# Patient Record
Sex: Male | Born: 1970 | Race: Black or African American | Hispanic: No | Marital: Married | Smoking: Never smoker
Health system: Southern US, Community
[De-identification: ages and names within clinical notes are randomized; demographics above are authoritative.]

## PROBLEM LIST (undated history)

## (undated) DIAGNOSIS — D8685 Sarcoid myocarditis: Secondary | ICD-10-CM

## (undated) DIAGNOSIS — Z950 Presence of cardiac pacemaker: Secondary | ICD-10-CM

## (undated) DIAGNOSIS — I10 Essential (primary) hypertension: Secondary | ICD-10-CM

## (undated) DIAGNOSIS — I442 Atrioventricular block, complete: Secondary | ICD-10-CM

## (undated) HISTORY — DX: Essential (primary) hypertension: I10

## (undated) HISTORY — DX: Sarcoid myocarditis: D86.85

## (undated) HISTORY — DX: Presence of cardiac pacemaker: Z95.0

## (undated) HISTORY — PX: INSERT / REPLACE / REMOVE PACEMAKER: SUR710

## (undated) HISTORY — DX: Atrioventricular block, complete: I44.2

---

## 2001-04-20 ENCOUNTER — Encounter: Payer: Self-pay | Admitting: *Deleted

## 2001-04-20 ENCOUNTER — Inpatient Hospital Stay (HOSPITAL_COMMUNITY): Admission: EM | Admit: 2001-04-20 | Discharge: 2001-04-22 | Payer: Self-pay | Admitting: *Deleted

## 2001-05-24 ENCOUNTER — Encounter: Payer: Self-pay | Admitting: Pulmonary Disease

## 2001-05-24 ENCOUNTER — Ambulatory Visit (HOSPITAL_COMMUNITY): Admission: RE | Admit: 2001-05-24 | Discharge: 2001-05-24 | Payer: Self-pay | Admitting: Pulmonary Disease

## 2001-05-24 ENCOUNTER — Encounter (INDEPENDENT_AMBULATORY_CARE_PROVIDER_SITE_OTHER): Payer: Self-pay | Admitting: *Deleted

## 2001-07-04 ENCOUNTER — Encounter: Payer: Self-pay | Admitting: Internal Medicine

## 2001-07-04 ENCOUNTER — Ambulatory Visit (HOSPITAL_COMMUNITY): Admission: RE | Admit: 2001-07-04 | Discharge: 2001-07-05 | Payer: Self-pay | Admitting: Internal Medicine

## 2001-07-05 ENCOUNTER — Encounter: Payer: Self-pay | Admitting: Internal Medicine

## 2007-03-16 ENCOUNTER — Ambulatory Visit: Payer: Self-pay | Admitting: Internal Medicine

## 2008-04-18 ENCOUNTER — Encounter: Payer: Self-pay | Admitting: Internal Medicine

## 2008-04-25 ENCOUNTER — Ambulatory Visit: Payer: Self-pay

## 2008-07-07 DIAGNOSIS — R079 Chest pain, unspecified: Secondary | ICD-10-CM | POA: Insufficient documentation

## 2008-07-07 DIAGNOSIS — Z950 Presence of cardiac pacemaker: Secondary | ICD-10-CM | POA: Insufficient documentation

## 2008-07-19 ENCOUNTER — Ambulatory Visit: Payer: Self-pay | Admitting: Internal Medicine

## 2008-08-09 ENCOUNTER — Ambulatory Visit: Payer: Self-pay

## 2008-10-02 ENCOUNTER — Ambulatory Visit: Payer: Self-pay | Admitting: Internal Medicine

## 2008-12-18 ENCOUNTER — Ambulatory Visit: Payer: Self-pay | Admitting: Cardiology

## 2008-12-18 ENCOUNTER — Encounter: Payer: Self-pay | Admitting: Internal Medicine

## 2008-12-18 ENCOUNTER — Ambulatory Visit: Payer: Self-pay | Admitting: Internal Medicine

## 2008-12-18 ENCOUNTER — Ambulatory Visit: Payer: Self-pay

## 2008-12-18 ENCOUNTER — Ambulatory Visit (HOSPITAL_COMMUNITY): Admission: RE | Admit: 2008-12-18 | Discharge: 2008-12-18 | Payer: Self-pay | Admitting: Cardiovascular Disease

## 2008-12-18 DIAGNOSIS — I10 Essential (primary) hypertension: Secondary | ICD-10-CM

## 2008-12-18 HISTORY — DX: Essential (primary) hypertension: I10

## 2009-01-10 ENCOUNTER — Ambulatory Visit (HOSPITAL_COMMUNITY): Admission: RE | Admit: 2009-01-10 | Discharge: 2009-01-10 | Payer: Self-pay | Admitting: Internal Medicine

## 2009-01-10 ENCOUNTER — Ambulatory Visit: Payer: Self-pay | Admitting: Internal Medicine

## 2009-01-11 ENCOUNTER — Encounter: Payer: Self-pay | Admitting: Internal Medicine

## 2009-01-28 ENCOUNTER — Encounter: Payer: Self-pay | Admitting: Internal Medicine

## 2009-01-28 ENCOUNTER — Ambulatory Visit: Payer: Self-pay

## 2009-08-23 ENCOUNTER — Encounter (INDEPENDENT_AMBULATORY_CARE_PROVIDER_SITE_OTHER): Payer: Self-pay | Admitting: *Deleted

## 2009-10-25 ENCOUNTER — Encounter (INDEPENDENT_AMBULATORY_CARE_PROVIDER_SITE_OTHER): Payer: Self-pay | Admitting: *Deleted

## 2010-02-25 NOTE — Cardiovascular Report (Signed)
Summary: Office Visit   Office Visit   Imported By: Roderic Ovens 01/31/2009 15:26:53  _____________________________________________________________________  External Attachment:    Type:   Image     Comment:   External Document

## 2010-02-25 NOTE — Procedures (Signed)
Summary: wound check   Current Medications (verified): 1)  None  Allergies (verified): No Known Drug Allergies  PPM Specifications Following MD:  Sherryl Manges, MD     PPM Vendor:  Medtronic     PPM Model Number:  [id number]     PPM Serial Number:  [id number] PPM DOI:  01/10/2009     PPM Implanting MD:  Sherryl Manges, MD  Lead 1    Location: RA     DOI: 07/04/2001     Model #: [id number]     Serial #: [id number]     Status: active Lead 2    Location: RV     DOI: 07/04/2001     Model #: [id number]     Serial #: [id number]     Status: active  Magnet Response Rate:  BOL 85 ERI  65  Indications:  Type II HB: Sarcoid  Explantation Comments:  01/10/2009 [employer] [id number] explanted  PPM Follow Up Remote Check?  No Battery Voltage:  2.79 V     Battery Est. Longevity:  10 YEARS     Pacer Dependent:  No       PPM Device Measurements Atrium  Amplitude: 2.8 mV, Impedance: 494 ohms, Threshold: 0.5 V at 0.4 msec Right Ventricle  Amplitude: 22.4 mV, Impedance: 630 ohms, Threshold: 1.125 V at 0.4 msec  Episodes MS Episodes:  1     Percent Mode Switch:  0%     Coumadin:  No Ventricular High Rate:  0     Atrial Pacing:  10.5%     Ventricular Pacing:  0.6%  Parameters Mode:  MVP (R)     Lower Rate Limit:  60     Upper Rate Limit:  130 Paced AV Delay:  320     Sensed AV Delay:  320 Next Cardiology Appt Due:  04/26/2009 Tech Comments:  Normal device check.  Wound check appt.  Steri-strips removed.  Wound without redness or edema.  No changes made today.  ROV 3 months SK. Gypsy Balsam RN BSN  January 28, 2009 11:45 AM

## 2010-02-25 NOTE — Letter (Signed)
Summary: Device-Delinquent Check  [facility], Main Office  [address]   [place], [place] [address]   Phone: [phone]  Fax: [phone]     August 23, 2009 MRN: [id number]   Kristopher Cannon [address] [place], [place]  [address]   Dear Mr. Rease,  According to our records, you have not had your implanted device checked in the recommended period of time.  We are unable to determine appropriate device function without checking your device on a regular basis.  Please call our office to schedule an appointment as soon as possible.  If you are having your device checked by another physician, please call us so that we may update our records.  Thank you,   Architectural technologist [facility]

## 2010-02-25 NOTE — Letter (Signed)
Summary: Device-Delinquent Check  [facility], Main Office  [address]   [place], [place] [address]   Phone: [phone]  Fax: [phone]     October 25, 2009 MRN: [id number]   ACXEL DINGEE [address] [place], [place]  [address]   Dear Mr. Yoon,  According to our records, you have not had your implanted device checked in the recommended period of time.  We are unable to determine appropriate device function without checking your device on a regular basis.  Please call our office to schedule an appointment with Dr Graciela Husbands ,  as soon as possible.  If you are having your device checked by another physician, please call us so that we may update our records.  Thank you,  Letta Moynahan, EMT  October 25, 2009 12:19 PM  [facility]

## 2010-04-28 LAB — BASIC METABOLIC PANEL
BUN: 15 mg/dL (ref 6–23)
Calcium: 8.8 mg/dL (ref 8.4–10.5)
Creatinine, Ser: 0.81 mg/dL (ref 0.4–1.5)
GFR calc non Af Amer: >60 mL/min (ref >60)
Potassium: 4.5 mEq/L (ref 3.5–5.1)

## 2010-04-28 LAB — CBC
HCT: 41 % (ref 39.0–52.0)
Platelets: 219 10*3/uL (ref 150–400)
WBC: 6.4 10*3/uL (ref 4.0–10.5)

## 2010-04-28 LAB — APTT: aPTT: 34 seconds (ref 24–37)

## 2010-04-28 LAB — PROTIME-INR
INR: 1.13 (ref 0.00–1.49)
Prothrombin Time: 14.4 seconds (ref 11.6–15.2)

## 2010-06-10 NOTE — Letter (Signed)
March 16, 2007    Carmell Austria, PA  [facility]  [address]  [place], [place] [address]   RE:  Kristopher, Cannon  MRN:  [id number]  /  DOB:  10/03/1970   Dear Carmell Austria:   I hope this letter finds you well.  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  It  has been about 4 years since we have seen him.  He has had no complaints  of chest pain or shortness of breath.  He finally came back in for  device follow-up today.   He is taking no medication.  He has no known drug allergies.   On examination, his blood pressure today was recorded on the wrong sheet  of paper, actually.  LUNGS:  Clear.  Heart sounds were regular.  EXTREMITIES:  Without edema.  Neck veins were flat.  The carotids were brisk and full bilaterally  without bruits.  ABDOMEN:  Soft.   Interrogation of his Guidant Insignia pulse generator demonstrates that  he is at [employer]  His P wave is 5.5 with impedance of 400, a threshold of  06 at 0.4.  The R wave was 12 with an impedance of 560, a threshold 0.1  at 0.6.  Estimated longevity is 1.2 years.   IMPRESSION:  1. Sarcoid heart disease.  2. Intermittent complete heart block.  3. Status post pacer for the above.  4. Poor follow-up.   Dr. Thomasena Edis, Kristopher Cannon is stable.  At this point we will plan to him  again in 6 months' time for device follow-up.   If there is anything we can do in the interim, please do not hesitate to  contact me.  My cell number is [phone].  Call for any questions you  might have about him or anybody else.    Sincerely,      Duke Salvia, MD, [facility]  Electronically Signed    SCK/MedQ  DD: 03/16/2007  DT: 03/17/2007  Job #: [id number]

## 2010-06-13 NOTE — H&P (Signed)
[place]. [facility]  Patient:    Kristopher Cannon, Kristopher Cannon Visit Number: [id number] MRN: [id number]          Service Type: MED Location: [id number] Attending Physician:  Junious Silk Dictated by:   Madolyn Frieze. Jens Som, M.D. [facility] Admit Date:  04/20/2001                           History and Physical  CHIEF COMPLAINT: The patient is a 40 year old male, with a past medical history of murmur by report, knee surgery, and hernia repair.  He presents for evaluation of chest pain.  HISTORY OF PRESENT ILLNESS: The patient has no prior cardiac history other than a murmur that he states was noted six years ago.  Over the past three weeks he has complained of chest pain.  This occurs in various locations in the chest and is described as a dull aching sensation without radiation.  It is not pleuritic or positional and is not related to food.  It is not exertional.  It lasts anywhere from five to 30 minutes and resolves spontaneously.  Of note, he denies any recent URI and there is no history of syncope, palpitations, dyspnea on exertion, orthopnea, PND, or pedal edema. There is no family history of sarcoid or sudden death.  He was seen at [facility] today and was referred to the emergency room.  He is presently asymptomatic.  MEDICATIONS: He is on no medications at present.  ALLERGIES: No known drug allergies.  SOCIAL HISTORY: He has a remote history of tobacco use but none in the past year and a half.  He denies any alcohol use.  He does have a history of cocaine use approximately six years ago.  FAMILY HISTORY: He denies coronary artery disease, sudden death or sarcoid. He does have a history of CVA.  PAST MEDICAL HISTORY: There is no hypertension, diabetes mellitus, or hyperlipidemia.  He states he was told he had a murmur six years ago.  PAST SURGICAL HISTORY:  1. Prior knee surgery.  2. Hernia repair.  REVIEW OF SYSTEMS: There  is no headache, fever or chills.  There is no productive cough or hemoptysis.  There is no dysphagia, odynophagia, melena, or hematochezia.  There is no dysuria or hematuria.  There is no rash or seizure activity.  There is no orthopnea, PND, or pedal edema.  There is no claudication noted.  The patient has never had a syncopal episode.  PHYSICAL EXAMINATION:  VITAL SIGNS: Blood pressure 141/73, pulse 106.  GENERAL: He is well-developed, well-nourished, in no acute distress.  SKIN: Warm and dry.  HEENT: Unremarkable, with normal eyelids.  NECK: Supple with normal upstrokes bilaterally and there are no bruits noted. There is no jugular venous distention, no thyromegaly noted.  CHEST: Clear to auscultation with normal expansion.  CARDIOVASCULAR: Regular rate and rhythm.  There is a 1/6 systolic murmur at the left sternal border.  There is a gallop noted.  ABDOMEN: Nontender, nondistended.  Positive bowel sounds.  No hepatosplenomegaly.  No masses appreciated.  No abdominal bruit.  He has 2+ femoral pulses bilaterally and no bruits.  EXTREMITIES: No edema.  I can palpate no cords.  He has 2+ posterior tibial pulses bilaterally.  NEUROLOGIC: Grossly intact.  LABORATORY DATA: His electrocardiogram from [facility] shows an atrial tachycardia with Wenckebach after review with Dr. Graciela Husbands.  The axis is normal.  There is left atrial  enlargement noted and diffuse mild ST elevation, with question of pericarditis.  Follow-up electrocardiogram here in the emergency room reveals normal sinus rhythm with a first degree AV block. Again noted is diffuse ST elevation with mild PR depression, with question of pericarditis.  His chest x-ray is pending.  Hemoglobin and hematocrit 12.7 and 37.9, WBC 6.4; platelet count 334,000.  BUN and creatinine are 11 and 1.0.  Initial CK 178.  DIAGNOSES:  1. Atypical chest pain.  2. Conduction disease.  PLAN: Mr. Westrup presents  for evaluation of chest pain.  The etiology of this is not clear to me.  His electrocardiogram suggests possible pericarditis, although this is not consistent with his history.  His electrocardiogram also shows conduction abnormalities.  This raises the possibility of sarcoidosis.  We will admit and rule out myocardial infarction with serial enzymes, although I think ischemia is unlikely.  We will also check a D-dimer as he has recently travelled to [place] but, again, I do not think pulmonary embolus is likely.  We will check an electrocardiogram to rule out pericardial effusion and to quantify his left ventricular function.  We will check an ACE level as well as rest stress thallium both to exclude ischemia and also to rule out sarcoidosis.  We will need to review his chest x-ray closely.  We will make further recommendations once we have the above studies. Dictated by:   Madolyn Frieze. Jens Som, M.D. [facility] Attending Physician:  Junious Silk DD:  04/20/01 TD:  04/21/01 Job: [id number]

## 2010-06-13 NOTE — Op Note (Signed)
[place]. [facility]  Patient:    Kristopher Cannon, Kristopher Cannon Visit Number: [id number] MRN: [id number]          Service Type: END Location: ENDO Attending Physician:  Silvio Pate Dictated by:   Barbaraann Share, M.D. [facility] Proc. Date: 05/24/01 Admit Date:  05/24/2001   CC:         Madolyn Frieze. Jens Som, M.D. [facility]   Operative Report  PROCEDURE:  Flexible fiberoptic bronchoscopy, with bronchoalveolar lavage and transbronchial lung biopsies.  INDICATIONS:  Pulmonary infiltrates in a 40 year old gentleman with lymphadenopathy of unknown etiology.  Rule out sarcoidosis.  OPERATOR:  Barbaraann Share, M.D. [facility]  ANESTHESIA:  Demerol 100 mg IV, Versed 10 mg IV, and topical 1% lidocaine to vocal cords during the procedure.  DESCRIPTION OF PROCEDURE:  After obtaining the informed consent and close cardiopulmonary monitoring, the above preoperative anesthesia was given.  the fiberoptic scope was passed through the right nare and then to the posterior pharynx, where there was no lesions or other abnormality seen. The vocal cords appeared to be within normal limits and moved bilaterally on pronation.  The scope was then passed into the trachea, where it was examined along the entire length down to the level of the carina; all of which was normal.  The left and right tracheobronchial trees were examined to the subsegmental level, with no endobronchial abnormality being found.  Attention was then paid to the right upper lobe bronchus, where bronchoalveolar lavage was done from the apical segment of the right upper lobe, as well as the posterior segment of the right upper lobe.  Lavage is also done from the posterobasilar segment of the right lower lobe.  These were sent for the usual cytologic and bacteriologic evaluation.  Then, under fluoroscopic guidance, transbronchial lung biopsies x3 were taken from the right upper lobe, into both of the apical segments and the  posterior segment of the right upper lobe; with adequate specimens being obtained.  On the last biopsy there was a moderate amount of bleeding that was well controlled with suctioning, and also tamponade with the scope in the posterior segment of the right upper lobe.  Adequate hemostasis was maintained and the procedure was terminated at that time.  The patient had excellent cough mechanics upon awakening, and was able to clear secretions in airways.  There were no immediate complications, and chest x-ray is pending at time of dictation to rule out pneumothorax, post transbronchial lung biopsy. Dictated by:   Barbaraann Share, M.D. [facility] Attending Physician:  Silvio Pate DD:  05/24/01 TD:  05/24/01 Job: [id number]

## 2010-06-13 NOTE — Discharge Summary (Signed)
[place]. [facility]  Patient:    Kristopher Cannon, Kristopher Cannon Visit Number: [id number] MRN: [id number]          Service Type: MED Location: [phone] Attending Physician:  Junious Silk Dictated by:   Pennelope Bracken, N.P. Admit Date:  04/20/2001 Discharge Date: 04/22/2001                             Discharge Summary  REASON FOR ADMISSION:  Chest pain.  DISCHARGE DIAGNOSES: 1. Hilar and mediastinal adenopathy consistent with sarcoid. 2. Atypical chest pain with a 2/6 systolic ejection murmur on examination,    with echocardiogram negative for valvular structural disease. 3. Recurrent arrhythmias:  First degree and second degree heart block and    Wenckebach observed during sleep and on recovery after stress test.  HISTORY OF PRESENT ILLNESS:  This delightful 40 year old male with no personal or family history of cardiac disease presented for evaluation in our ED of three weeks progressive chest pain.  The pain occurred in various locations in the chest and was described as a dull ache without radiation.  The pain resolved spontaneously.  The spells last from five to 30 minutes and resolved spontaneously.  He was seen by family practice and was referred to [place].  His echo from [place] showed atrial tachycardia with Wenckebach, left atrial enlargement, and diffuse mild ST elevation.  HOSPITAL COURSE:  The patient was admitted and stabilized.  He was placed on telemetry and serial labs were monitored.  His EKG performed at [facility] revealed normal sinus rhythm with first degree AV block with the diffuse ST elevation and mild PR depression seen earlier.  Chest x-ray showed right paratracheal soft tissue prominence with some mild right upper lobe infiltrate.  The CT chest was then ordered which revealed bulky mediastinal and mild left hilar adenopathy.  CT of the lower extremities was within normal limits.  The patients cardiac enzymes were  cycled and these were normal.  A 2-D echo was conducted and this revealed no pericardial effusion, with valves and structures within normal limits.  The patient underwent a stress thallium over the course of two days (due to machine malfunction after stress on day #1). The thallium, read by Dr. Fredia Sorrow, was without gated images but revealed perfusion abnormality on both resting and stress images with a reduction in perfusion in the inferior wall.  It was unclear whether this represented a fixed perfusion abnormality and Dr. Andee Lineman is slated to review these images.  A pulmonary consult was requested and this was performed by Dr. Shelle Iron.  It was felt that the patients adenopathy by CT was consistent with a sarcoidosis and since the patient was asymptomatic he would not treat this with steroids. Dr. Shelle Iron was not clear on whether the patients conduction system abnormality was due to sarcoid or not and given the lack of symptoms was hesitant to treat him with steroids.  He discussed the option of a transbronchial biopsy versus mediastinoscopy with the patient, and the patient chose the transbronchial biopsy.  The patient did experience some second degree AV block while sleeping, and on March 27 some Wenckebach was noted in early recovery after treadmill stress. On day of discharge there was another short spell of second degree heart block in early recovery after another treadmill stress test.  This was not as extended as the prior days conduction abnormality.  PHYSICAL EXAMINATION ON DAY OF DISCHARGE:  Patient  offered no complaints of chest pain or dyspnea.  VITAL SIGNS:  Blood pressure 135/69, pulse 70, respirations 20, pulse oximetry 99% on room air.  Telemetry at time of exam was sinus rhythm with first degree AV block in the 70s.  GENERAL:  The patient is in no acute distress.  CARDIOVASCULAR:  There is a 2/6 systolic murmur heard best at the right sternal border with clear S1 and  S2.  LUNGS:  Clear to auscultation bilaterally.  EXTREMITIES:  Without cyanosis, clubbing, or edema.  LABORATORY DATA:  Cardiac enzymes were negative x3.  An ACE level was high at 94.  ESR normal at 7.  Lipids showed total cholesterol 115, triglycerides 61, HDL 66, LDL 37.  Urine drug screen negative.  Other radiologic tests as noted above.  DISPOSITION:  The patient is discharged to home in the care of his wife on no new medications.  ACTIVITY RESTRICTION:  As tolerated.  Patient is to report dizziness, chest pain, or shortness of breath.  FOLLOW-UP:  Patient will follow up Monday, April 25, 2001 at 4:15 p.m. with Dr. Shelle Iron, pulmonologist.  He agrees to pick up his films at [facility] prior to this appointment.  He will then see Dr. Jens Som on May 06, 2001 at 11:15 a.m.  Hopefully, biopsy results will be available at that point and the patient can be set up for an electrophysiology study. Dictated by:   Pennelope Bracken, N.P. Attending Physician:  Junious Silk DD:  04/22/01 TD:  04/22/01 Job: [id number]

## 2010-06-13 NOTE — Procedures (Signed)
[place]. [facility]  Patient:    DELMO, MATTY Visit Number: [id number] MRN: [id number]          Service Type: CAT Location: [phone] Attending Physician:  Nathen May Dictated by:   Nathen May, M.D., [facility] Proc. 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  PROCEDURE:  Dual-chamber pacemaker implantation.  DESCRIPTION OF PROCEDURE:  Following the obtaining of informed consent, the patient was brought to the electrophysiology laboratory and placed on the fluoroscopic table in the supine position.  After routine prep and drape of the left upper chest, intravenous contrast was injected via the left antecubital vein to identify the course and the patency of the extrathoracic left subclavian vein.  This having been accomplished, lidocaine was infiltrated in the prepectoral subclavicular region just medial to the pectoral groove, and the incision was made and carried down to the level of the prepectoral fascia using sharp dissection and electrocautery.  A pocket was performed similarly, and hemostasis was obtained.  Thereafter attention was gained to gaining access to the extrathoracic left subclavian vein, which was accomplished without difficulty without the aspiration of air or puncture of the artery.  Two separate venipunctures were accomplished, and two guidewires were placed and retained.  Initially a Medtronic 5076 58-cm active-fixation lead was passed into the ventricle.  The serial number was [id number].  Unfortunately, the cameras RAO/LAO movement was  blocked and so I ended up having to put the lead in the right ventricular apex, where the bipolar R-wave was 12 millivolts with impedance of 506 Ohms, a pacing threshold shortly after screw deployment of 1.3 volts at 0.5 msec, the currented threshold 2.0 MA, and there was no diaphragmatic pacing at 10 volts.  This lead was then secured to the prepectoral fascia and marked with a tie.  Thereafter, another 7 [place] tear-away introducer sheath was placed, through which was passed a Medtronic 5076 52-cm active-fixation lead, serial number [id number].  This was manipulated to the right atrial appendage, where the bipolar P-wave was 4.2 millivolts with impedance of 810 Ohms and a pacing threshold also shortly after lead deployment of 1.2 volts at 0.5 msec with a currented threshold of 1.5 MA.  With these acceptable parameters recorded, this lead was then also secured to the prepectoral fascia and hemostasis was secured.  The leads were then attached to a Guidant Insignia Plus pulse generator model 1298, serial number [id number].  P-synchronous pacing was identified.  The pocket was copiously irrigated with antbiotic-containing saline solution and the leads and the pulse generator were placed in the pocket and secured to the prepectoral fascia.  The wound was closed in three layers in the normal fashion.  The wound was washed, dried, and a benzoin and Steri-Strip dressing was then applied.  Needle counts, sponge counts, and instrument counts were correct at the completion of the procedure according to the staff.  The patient tolerated the procedure without apparent complication. Dictated by:   Nathen May, M.D., [facility] Attending Physician:  Nathen May DD:  07/04/01 TD:  07/06/01 Job: [id number]

## 2011-07-04 IMAGING — CR DG CHEST 2V
2 series · 2 of 2 positions shown · non-contrast
Comparison: Chest radiograph 09/04/2001

CLINICAL DATA: Left ventricular dysfunction

CHEST - 1 VIEW

[view not recorded (1 of 2)]
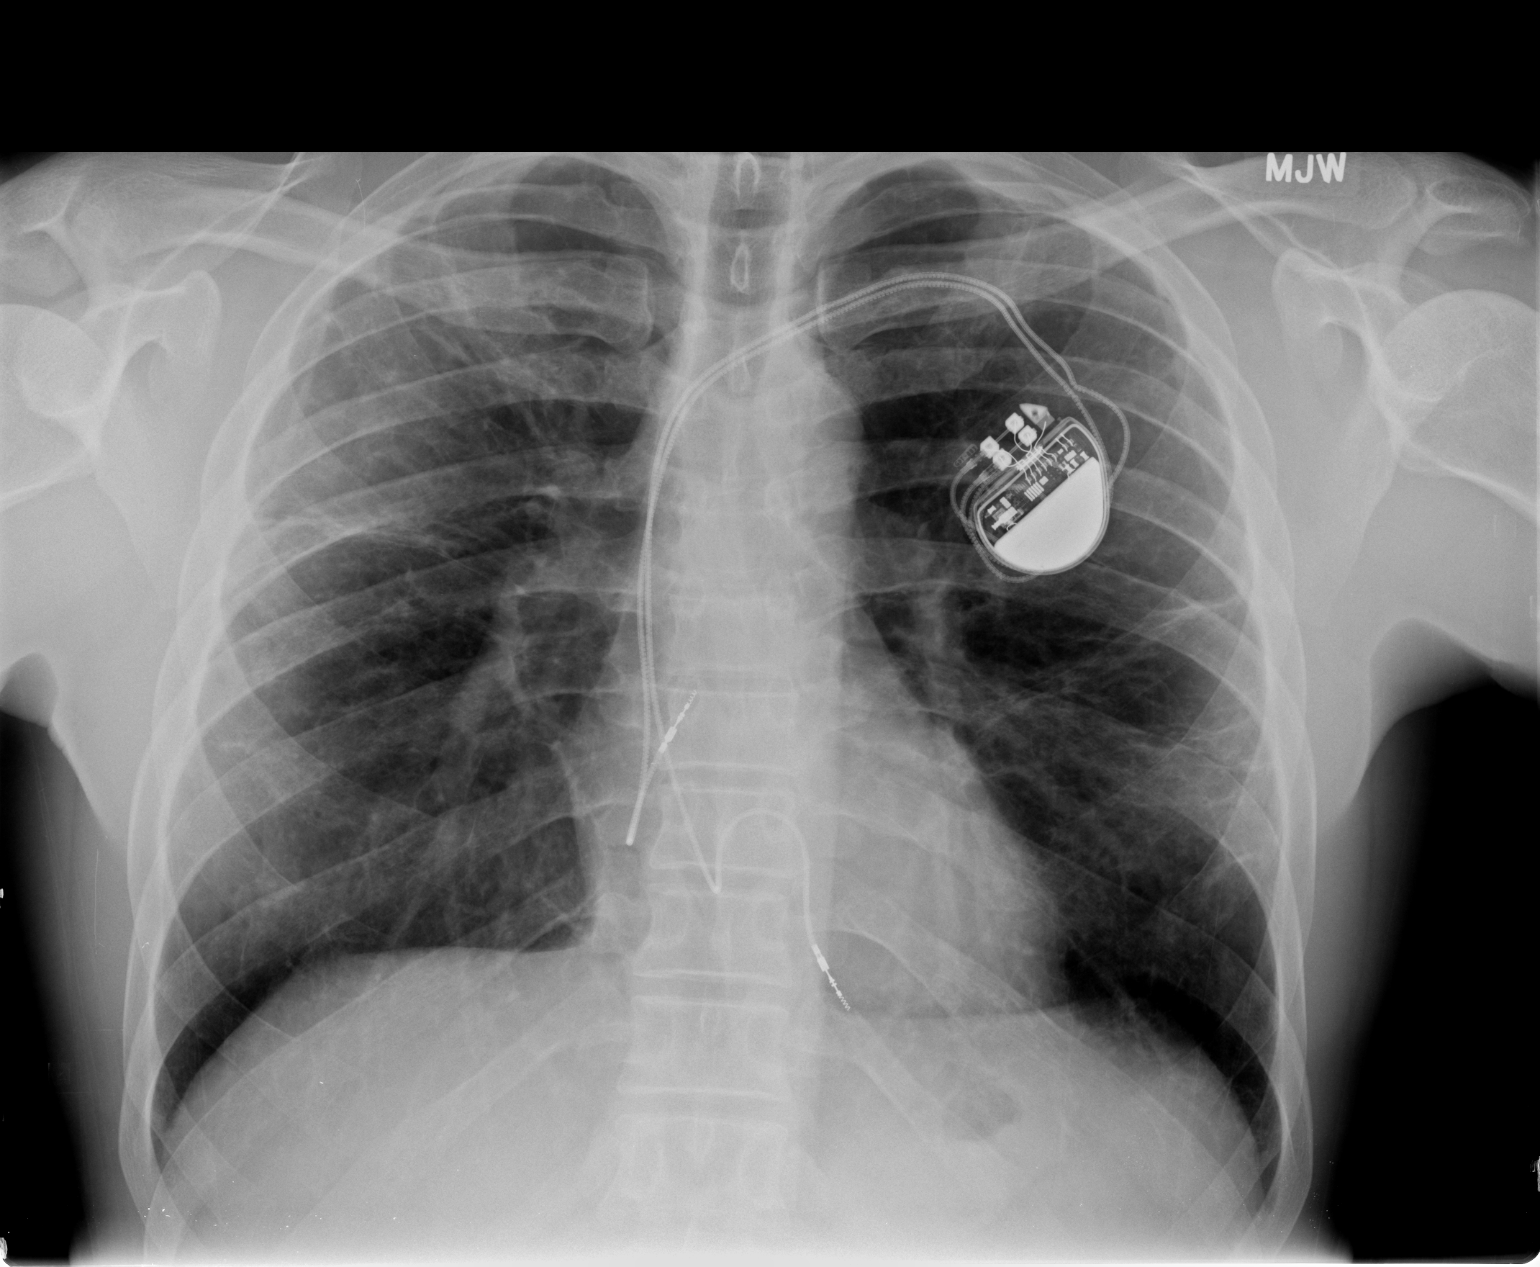

[view not recorded (2 of 2)]
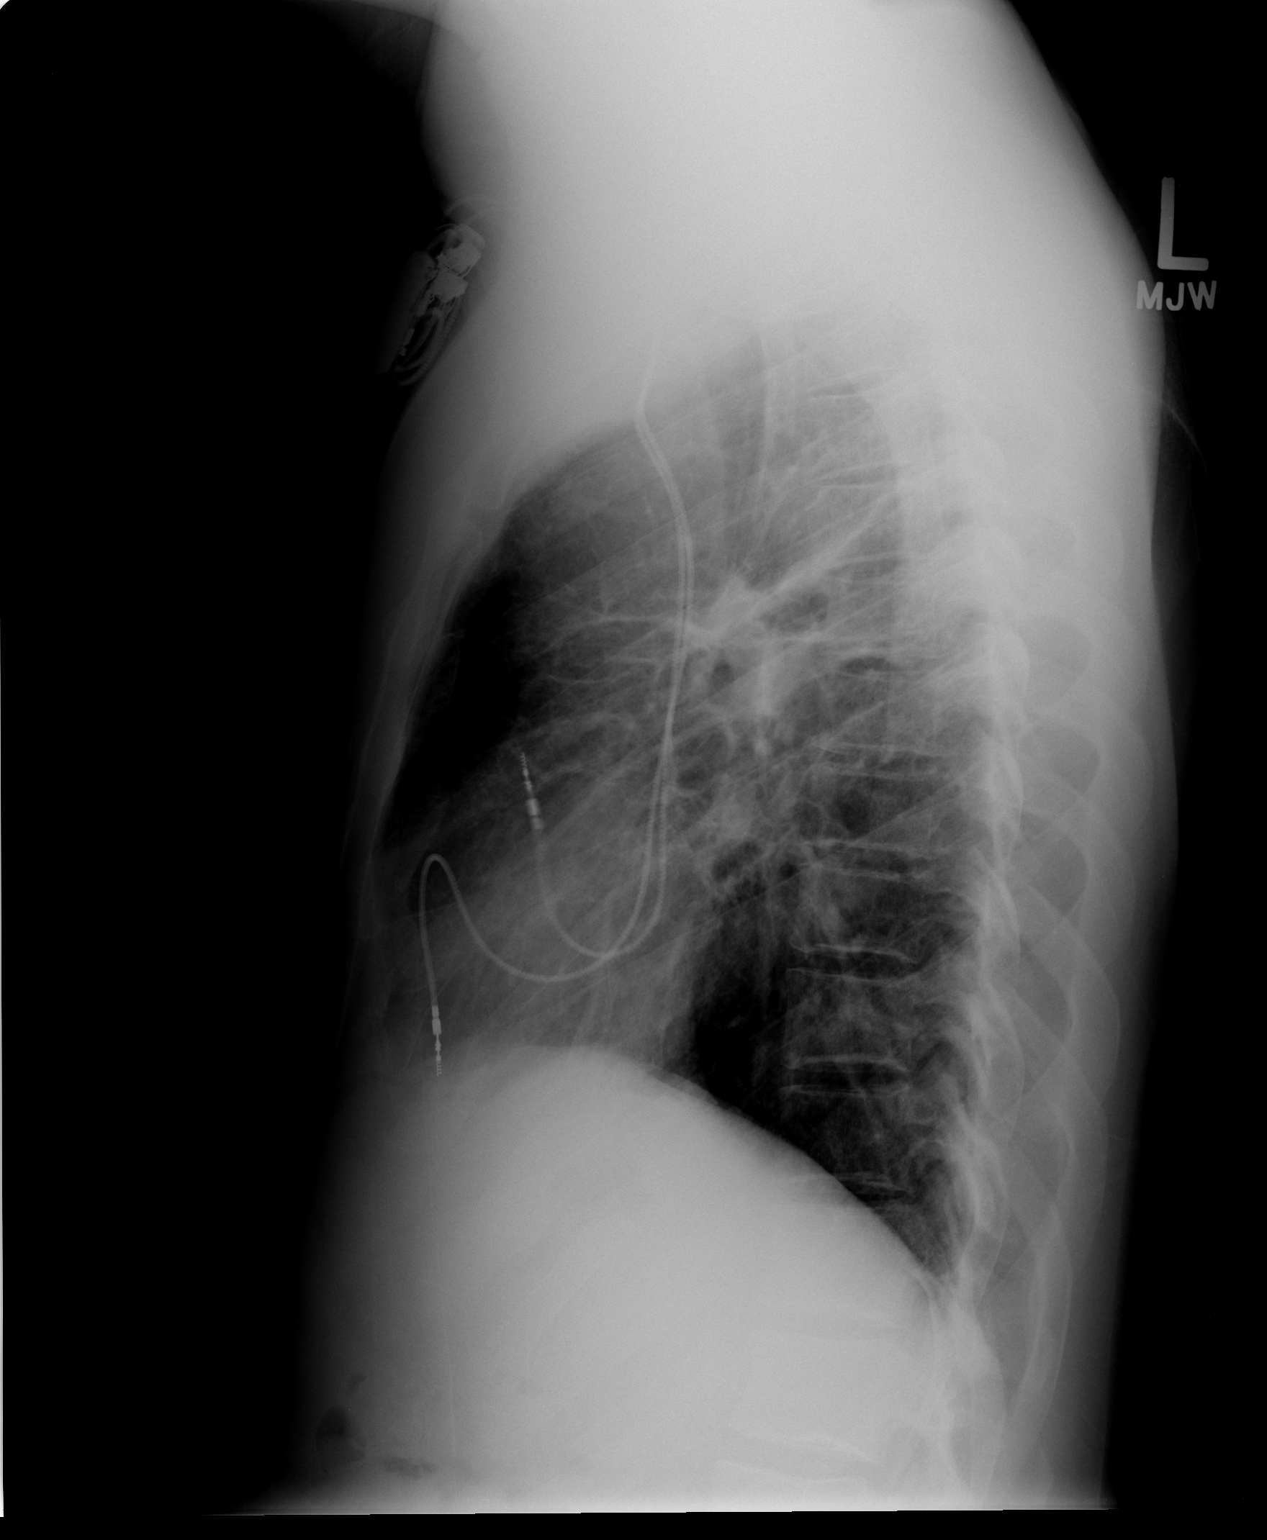

[2 of 2 positions shown; findings below may reference images not displayed]

FINDINGS: Left-sided pacemaker overlies normal cardiac
silhouette.  Costophrenic angles are clear.  There is bilateral
linear  atelectasis and scarring not significantly changed from
prior.  No evidence of pneumothorax.  No pulmonary edema.  No focal
consolidation.
IMPRESSION: No active disease.

## 2013-03-10 ENCOUNTER — Encounter: Payer: Self-pay | Admitting: Internal Medicine

## 2013-04-20 ENCOUNTER — Encounter: Payer: Self-pay | Admitting: Internal Medicine

## 2013-04-26 ENCOUNTER — Ambulatory Visit (INDEPENDENT_AMBULATORY_CARE_PROVIDER_SITE_OTHER): Payer: 59 | Admitting: Internal Medicine

## 2013-04-26 ENCOUNTER — Encounter: Payer: Self-pay | Admitting: Internal Medicine

## 2013-04-26 VITALS — BP 159/107 | HR 75 | Ht 67.5 in | Wt 176.0 lb

## 2013-04-26 DIAGNOSIS — Z950 Presence of cardiac pacemaker: Secondary | ICD-10-CM

## 2013-04-26 DIAGNOSIS — I43 Cardiomyopathy in diseases classified elsewhere: Secondary | ICD-10-CM

## 2013-04-26 DIAGNOSIS — D8685 Sarcoid myocarditis: Secondary | ICD-10-CM

## 2013-04-26 DIAGNOSIS — D869 Sarcoidosis, unspecified: Secondary | ICD-10-CM

## 2013-04-26 DIAGNOSIS — I442 Atrioventricular block, complete: Secondary | ICD-10-CM

## 2013-04-26 DIAGNOSIS — I441 Atrioventricular block, second degree: Secondary | ICD-10-CM

## 2013-04-26 LAB — MDC_IDC_ENUM_SESS_TYPE_INCLINIC
Battery Remaining Longevity: 141 mo
Battery Voltage: 2.79 V
Brady Statistic AP VP Percent: 0 %
Brady Statistic AS VP Percent: 0 %
Lead Channel Impedance Value: 493 Ohm
Lead Channel Pacing Threshold Amplitude: 0.5 V
Lead Channel Sensing Intrinsic Amplitude: 4 mV
Lead Channel Setting Pacing Amplitude: 1.5 V
Lead Channel Setting Pacing Amplitude: 2 V
Lead Channel Setting Pacing Pulse Width: 0.4 ms
MDC IDC MSMT BATTERY IMPEDANCE: 225 Ohm
MDC IDC MSMT LEADCHNL RA PACING THRESHOLD PULSEWIDTH: 0.4 ms
MDC IDC MSMT LEADCHNL RV IMPEDANCE VALUE: 612 Ohm
MDC IDC MSMT LEADCHNL RV PACING THRESHOLD AMPLITUDE: 1.25 V
MDC IDC MSMT LEADCHNL RV PACING THRESHOLD PULSEWIDTH: 0.4 ms
MDC IDC MSMT LEADCHNL RV SENSING INTR AMPL: 11.2 mV
MDC IDC SESS DTM: 20150401111107
MDC IDC SET LEADCHNL RV SENSING SENSITIVITY: 4 mV
MDC IDC STAT BRADY AP VS PERCENT: 12 %
MDC IDC STAT BRADY AS VS PERCENT: 87 %

## 2013-04-26 NOTE — Patient Instructions (Signed)
Your physician recommends that you continue on your current medications as directed. Please refer to the Current Medication list given to you today.  Your physician recommends that you schedule a follow-up appointment in: 2 months with PA/NP for blood pressure follow up  Your physician wants you to follow-up in: 6 months with device clinic.  You will receive a reminder letter in the mail two months in advance. If you don't receive a letter, please call our office to schedule the follow-up appointment.  Your physician wants you to follow-up in: 1 year with Dr. Graciela HusbandsKlein.  You will receive a reminder letter in the mail two months in advance. If you don't receive a letter, please call our office to schedule the follow-up appointment.

## 2013-04-26 NOTE — Progress Notes (Signed)
      Patient Care Team: Provider Default, MD as PCP - General   HPI  Kristopher Cannon is a 43 y.o. male Seen after a hiatus of about 4 years. 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  There have been no palpitations, lightheadedness or syncope.     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 Complete heart block   . Pacemaker-Medtronic   . 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 Insert / replace / remove pacemaker      No current outpatient prescriptions on file.   No current facility-administered medications for this visit.    Soc   Works at [employer] Review of Systems negative except from HPI and PMH  Physical Exam BP 159/107  Pulse 75  Ht 5' 7.5" (1.715 m)  Wt 176 lb (79.833 kg)  BMI 27.14 kg/m2 Well developed and nourished in no acute distress HENT normal Neck supple with JVP-flat Device pocket well healed; without hematoma or erythema.  There is no tethering  Clear Regular rate and rhythm, no murmurs or gallops Abd-soft with active BS No Clubbing cyanosis edema Skin-warm and dry A & Oriented  Grossly normal sensory and motor function     Assessment and  Plan   Complete heart block and intermittent  Pacemaker-Medtronic  The patient's device was interrogated and the information was fully reviewed.  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 Is not clear whether he has sarcoid although it is likely as Myoview scanning in 2003  identified on his perfusion defects.  At this juncture we will seek to assess blood pressure over time;  will have him come back in about 2 months with a series of weekly blood pressure recordings   therapy might be appropriate  We'll see him in 6 months in the device clinic.

## 2013-05-15 ENCOUNTER — Encounter: Payer: Self-pay | Admitting: Cardiology

## 2013-06-23 ENCOUNTER — Encounter: Payer: Self-pay | Admitting: *Deleted

## 2013-06-30 ENCOUNTER — Ambulatory Visit: Payer: 59 | Admitting: Cardiology

## 2013-06-30 ENCOUNTER — Encounter: Payer: 59 | Admitting: Physician Assistant

## 2013-06-30 NOTE — Progress Notes (Signed)
This encounter was created in error - please disregard.

## 2013-06-30 NOTE — Progress Notes (Deleted)
  Cardiology Office Note   Date:  06/30/2013   ID:  Kristopher Cannon, DOB 05-14-1970, MRN [id number]  PCP:  Default, Provider, MD  Cardiologist:  Dr. Sherryl Manges      History of Present Illness: [name] is a 43 y.o. 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  Last seen by Dr. Sherryl Manges in 04/2013.  BP was high and he was asked to monitor this over 2 mos and return for follow up today.  ***   Studies:  - Echo (11/2008):   EF is 50-55%. Wall thickness was increased in a pattern of mild LVH. Wall motion was normal  - Nuclear (03/2001):  No ischemia.  Irregular perfusion in inf wall on rest and stress images may be related to sarcoid or other infiltrative process.     Recent Labs: No results found for requested labs within last 365 days.  Wt Readings from Last 3 Encounters:  04/26/13 176 lb (79.833 kg)  10/02/08 168 lb (76.204 kg)  07/19/08 168 lb (76.204 kg)     Past Medical History  Diagnosis Date  . Complete heart block   . Pacemaker-Medtronic   . 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 HYPERTENSION, BENIGN 12/18/2008    Qualifier: Diagnosis of  By: Graciela Husbands, MD, Ruthann Cancer Ty Hilts     No current outpatient prescriptions on file.   No current facility-administered medications for this visit.    Allergies:   Review of patient's allergies indicates not on file.   Social History:  The patient     Family History:  The patient's family history includes Heart disease in his mother; Stroke in his mother.   ROS:  Please see the history of present illness.   ***   All other systems reviewed and negative.   PHYSICAL EXAM: VS:  There were no vitals taken for this visit. 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

## 2013-07-05 ENCOUNTER — Encounter: Payer: Self-pay | Admitting: Physician Assistant

## 2013-11-22 ENCOUNTER — Encounter: Payer: Self-pay | Admitting: *Deleted

## 2014-01-03 ENCOUNTER — Encounter: Payer: Self-pay | Admitting: *Deleted

## 2015-03-20 ENCOUNTER — Telehealth: Payer: Self-pay | Admitting: *Deleted

## 2015-03-20 ENCOUNTER — Telehealth: Payer: Self-pay | Admitting: Internal Medicine

## 2015-03-20 NOTE — Telephone Encounter (Signed)
Spoke with [employer] at [employer]. She said that pt is scheduled for surgery of left clavicle fixation tomorrow 2 /23/17 pt was seen in 04/26/13 in Dr. Odessa Fleming clinic. The pacemaker was interrogated then. It was recommended then for pt to return to the device clinic in 6 months. Lean states that pt said the pacemaker was turned off after it was interrogated. The  device clinic note reads" the device was reprogrammed to inactive rate response". Leann WOULD LIKE TO KNOW IF THE DEVICE IS OFF LIKE THE PT SAID IT IS. Janean Sark PA reviewed pt's last office visit with was April 1 st 2015. Pt has not been seen in this office or have the pacemake interrogated since them. This office can't comment on the pacer settings, because pt has not been in this office for 2 years. Leann verbalized understanding and states that she  have the Medtronics rep. information if it needed.

## 2015-03-20 NOTE — Telephone Encounter (Signed)
error 

## 2015-03-20 NOTE — Telephone Encounter (Signed)
New Message  Request for surgical clearance:  1. What type of surgery is being performed? Left Clavicle fixation  2. When is this surgery scheduled? 03/21/2015   3. Are there any medications that need to be held prior to surgery and how long? Request a call back to discuss the device. Depends on what is told they may need to get clearance from another provider   4. Name of physician performing surgery? Dr. Corinna Capra  5. What is your office phone and fax number? Fax: [phone] Tel #  [phone]

## 2015-03-20 NOTE — Telephone Encounter (Signed)
Leann with [facility] calling back for records from last device check. Dr. Boykin Reaper from anesthesiology got on the phone. She is requesting that the records be sent to them from the last device check- I will transfer to medical records but I made it clear that I cannot give recommendations for surgery tomorrow related to the device. She is agreeable.  Transferred to Medical Records dept as this procedure is tomorrow.

## 2019-10-18 DIAGNOSIS — Z20822 Contact with and (suspected) exposure to covid-19: Secondary | ICD-10-CM | POA: Diagnosis not present

## 2019-12-14 DIAGNOSIS — M898X1 Other specified disorders of bone, shoulder: Secondary | ICD-10-CM | POA: Diagnosis not present

## 2019-12-14 DIAGNOSIS — M542 Cervicalgia: Secondary | ICD-10-CM | POA: Diagnosis not present

## 2019-12-14 DIAGNOSIS — Z888 Allergy status to other drugs, medicaments and biological substances status: Secondary | ICD-10-CM | POA: Diagnosis not present

## 2019-12-14 DIAGNOSIS — Z950 Presence of cardiac pacemaker: Secondary | ICD-10-CM | POA: Diagnosis not present

## 2019-12-14 DIAGNOSIS — M19012 Primary osteoarthritis, left shoulder: Secondary | ICD-10-CM | POA: Diagnosis not present

## 2019-12-14 DIAGNOSIS — M25512 Pain in left shoulder: Secondary | ICD-10-CM | POA: Diagnosis not present

## 2019-12-14 DIAGNOSIS — R030 Elevated blood-pressure reading, without diagnosis of hypertension: Secondary | ICD-10-CM | POA: Diagnosis not present

## 2019-12-31 DIAGNOSIS — Z20822 Contact with and (suspected) exposure to covid-19: Secondary | ICD-10-CM | POA: Diagnosis not present

## 2020-04-02 ENCOUNTER — Ambulatory Visit: Payer: BC Managed Care – PPO | Admitting: Internal Medicine

## 2020-04-02 ENCOUNTER — Other Ambulatory Visit: Payer: Self-pay

## 2020-04-02 ENCOUNTER — Encounter: Payer: Self-pay | Admitting: Internal Medicine

## 2020-04-02 VITALS — BP 170/110 | HR 86 | Ht 67.5 in | Wt 172.0 lb

## 2020-04-02 DIAGNOSIS — I442 Atrioventricular block, complete: Secondary | ICD-10-CM | POA: Diagnosis not present

## 2020-04-02 DIAGNOSIS — Z950 Presence of cardiac pacemaker: Secondary | ICD-10-CM

## 2020-04-02 DIAGNOSIS — D8685 Sarcoid myocarditis: Secondary | ICD-10-CM

## 2020-04-02 MED ORDER — LOSARTAN POTASSIUM-HCTZ 50-12.5 MG PO TABS: 1.0000 | ORAL_TABLET | Freq: Every day | ORAL | 3 refills | Status: DC

## 2020-04-02 NOTE — Patient Instructions (Signed)
Medication Instructions:  Your physician has recommended you make the following change in your medication:   ** Begin Losartan/HCTZ 50-12.5mg  - 1 tablet by mouth daily.  *If you need a refill on your cardiac medications before your next appointment, please call your pharmacy*   Lab Work: BMET in 2 weeks with your PCP.  If you have labs (blood work) drawn today and your tests are completely normal, you will receive your results only by: Marland Kitchen MyChart Message (if you have MyChart) OR . A paper copy in the mail If you have any lab test that is abnormal or we need to change your treatment, we will call you to review the results.   Testing/Procedures: PET scan to be scheduled with [facility] - You will be contacted re: scheduling this testing.   Follow-Up: At [facility], you and your health needs are our priority.  As part of our continuing mission to provide you with exceptional heart care, we have created designated Provider Care Teams.  These Care Teams include your primary Cardiologist (physician) and Advanced Practice Providers (APPs -  Physician Assistants and Nurse Practitioners) who all work together to provide you with the care you need, when you need it.  We recommend signing up for the patient portal called "MyChart".  Sign up information is provided on this After Visit Summary.  MyChart is used to connect with patients for Virtual Visits (Telemedicine).  Patients are able to view lab/test results, encounter notes, upcoming appointments, etc.  Non-urgent messages can be sent to your provider as well.   To learn more about what you can do with MyChart, go to [email or web address].    Your next appointment:   12 month(s)  The format for your next appointment:   In Person  Provider:   Sherryl Manges, MD

## 2020-04-02 NOTE — Progress Notes (Signed)
ELECTROPHYSIOLOGY OFFICE NOTE  Patient ID: Kristopher Cannon, MRN: [id number], DOB/AGE: Jan 14, 1971 50 y.o. Admit date: (Not on file) Date of Consult: 04/02/2020  Primary Physician: Default, Provider, MD Primary Cardiologist new     Nayel Purdy is a 50 y.o. male who is being seen today for the evaluation of pacemaker  .    HPI Romney Compean is a 50 y.o. male seen after hiatus of 7 years which was after hiatus of 4 years.  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  Biopsy results from 2003 were reviewed where he was found to have noncaseating granulomata in his lungs nuclear medicine imaging 2003 demonstrated irregular perfusion consistent with an infiltrative process thus presumed to be sarcoid  At this point he has had no complaints.  No chest pain or shortness of breath.  No lightheadedness.  There is some ventricular pacing, less than 0.1%.  Blood pressure has been identified is elevated.  Previously on lisinopril.  Not taking now.   Past Medical History:  Diagnosis Date  . Complete heart block (HCC)   . HYPERTENSION, BENIGN 12/18/2008   Qualifier: Diagnosis of  By: Graciela Husbands, MD, Ruthann Cancer Ty Hilts   . Pacemaker-Medtronic   . 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 INSERT / REPLACE / REMOVE PACEMAKER       Home Meds: Current Meds  Medication Sig  . lisinopril (ZESTRIL) 10 MG tablet Take 10 mg by mouth daily.    Allergies:  Allergies  Allergen Reactions  . Prednisone Hives    Hives and rash    Social History   Socioeconomic History  . Marital status: Married    Spouse name: Not on file  . Number of children: Not on file  . Years of education: Not on file  . Highest education level: Not on file  Occupational History  . Not on file  Tobacco Use  . Smoking status: Never Smoker  . Smokeless  tobacco: Never Used  Vaping Use  . Vaping Use: Never used  Substance and Sexual Activity  . Alcohol use: Never  . Drug use: Never  . Sexual activity: Not on file  Other Topics Concern  . Not on file  Social History Narrative  . Not on file   Social Determinants of Health   Financial Resource Strain: Not on file  Food Insecurity: Not on file  Transportation Needs: Not on file  Physical Activity: Not on file  Stress: Not on file  Social Connections: Not on file  Intimate Partner Violence: Not on file     Family History  Problem Relation Age of Onset  . Heart disease Mother   . Stroke Mother      ROS:  Please see the history of present illness.     All other systems reviewed and negative.    Physical Exam:  Blood pressure (!) 170/110, pulse 86, height 5' 7.5" (1.715 m), weight 172 lb (78 kg), SpO2 98 %. General: Well developed, well nourished male in no acute distress. Head: Normocephalic, atraumatic, sclera non-icteric, no xanthomas, nares are without discharge. EENT: normal  Lymph Nodes:  none Neck: Negative for carotid bruits. JVD not elevated. Back:without scoliosis kyphosis  Lungs: Clear bilaterally to auscultation without wheezes, rales, or rhonchi. Breathing is unlabored. Heart: RRR with S1 S2. No murmur . No rubs, or gallops  appreciated. Abdomen: Soft, non-tender, non-distended with normoactive bowel sounds. No hepatomegaly. No rebound/guarding. No obvious abdominal masses. Msk:  Strength and tone appear normal for age. Extremities: No clubbing or cyanosis. No edema.  Distal pedal pulses are 2+ and equal bilaterally. Skin: Warm and Dry Neuro: Alert and oriented X 3. CN III-XII intact Grossly normal sensory and motor function . Psych:  Responds to questions appropriately with a normal affect.      Labs: Cardiac Enzymes No results for input(s): CKTOTAL, CKMB, TROPONINI in the last 72 hours. CBC Lab Results  Component Value Date   WBC 6.4 01/10/2009   HGB  13.8 01/10/2009   HCT 41.0 01/10/2009   MCV 82.1 01/10/2009   PLT 219 01/10/2009   PROTIME: No results for input(s): LABPROT, INR in the last 72 hours. Chemistry No results for input(s): NA, K, CL, CO2, BUN, CREATININE, CALCIUM, PROT, BILITOT, ALKPHOS, ALT, AST, GLUCOSE in the last 168 hours.  Invalid input(s): LABALBU Lipids No results found for: CHOL, HDL, LDLCALC, TRIG BNP No results found for: PROBNP Thyroid Function Tests: No results for input(s): TSH, T4TOTAL, T3FREE, THYROIDAB in the last 72 hours.  Invalid input(s): FREET3 Miscellaneous No results found for: DDIMER  Radiology/Studies:  No results found.  EKG: Sinus at 86 Intervals 25/09/35   Assessment and Plan:  Intermittent heart block  Pacemaker-Medtronic  First-degree AV block  Sarcoid-pulmonary noncaseating granulomata//cardiac abnormal thallium perfusion  Hypertension  Device interrogation demonstrates some SVT.  No ventricular arrhythmias.  At this point the question would be is a sarcoid active or quiescient.  Would have to presume the latter.  PET scanning might be useful.  Intermittent heart block in the past however with family support a presumptive diagnosis of cardiac sarcoid.  The need for anti-inflammatories would be based on PET scanning so we will arrange that.  Do at [facility].  Blood pressure is poorly controlled.  We will begin him on losartan HCT and have him follow-up with his PCP      Sherryl Manges

## 2020-04-04 ENCOUNTER — Telehealth: Payer: Self-pay | Admitting: Internal Medicine

## 2020-04-04 NOTE — Telephone Encounter (Signed)
    Pt is calling about is lab work. He said Dr. Graciela Husbands will send the order to [facility], he said he will get the blood work in 2 weeks

## 2020-04-04 NOTE — Telephone Encounter (Signed)
BMETorder was given to pt at his last OV 04/02/2020 to take to his PCP to be drawn in 2 weeks.

## 2020-04-10 DIAGNOSIS — Z20822 Contact with and (suspected) exposure to covid-19: Secondary | ICD-10-CM | POA: Diagnosis not present

## 2020-04-10 DIAGNOSIS — I10 Essential (primary) hypertension: Secondary | ICD-10-CM | POA: Diagnosis not present

## 2020-04-10 DIAGNOSIS — Z03818 Encounter for observation for suspected exposure to other biological agents ruled out: Secondary | ICD-10-CM | POA: Diagnosis not present

## 2020-04-10 LAB — CUP PACEART INCLINIC DEVICE CHECK
Date Time Interrogation Session: 20220308143000
Implantable Pulse Generator Implant Date: 20101216

## 2020-04-11 ENCOUNTER — Telehealth: Payer: Self-pay

## 2020-04-11 NOTE — Telephone Encounter (Signed)
Forms completed for [facility] PET facility/myloid scan.  Placed with medical records for fax and scan.  Pt is scheduled to have BMET with PCP on or after 04/16/2020.  Order was given with pt at his OV with Dr Graciela Husbands on 04/02/2020.

## 2020-05-20 ENCOUNTER — Telehealth: Payer: Self-pay | Admitting: Internal Medicine

## 2020-05-20 NOTE — Telephone Encounter (Signed)
Kristopher Cannon from [facility] calling for authorization number for the patient's PET scan. Phone: [phone]

## 2020-05-29 DIAGNOSIS — D8685 Sarcoid myocarditis: Secondary | ICD-10-CM | POA: Diagnosis not present

## 2020-05-29 DIAGNOSIS — Z0389 Encounter for observation for other suspected diseases and conditions ruled out: Secondary | ICD-10-CM | POA: Diagnosis not present

## 2020-12-20 DIAGNOSIS — R030 Elevated blood-pressure reading, without diagnosis of hypertension: Secondary | ICD-10-CM | POA: Diagnosis not present

## 2020-12-20 DIAGNOSIS — Z791 Long term (current) use of non-steroidal anti-inflammatories (NSAID): Secondary | ICD-10-CM | POA: Diagnosis not present

## 2020-12-20 DIAGNOSIS — Z950 Presence of cardiac pacemaker: Secondary | ICD-10-CM | POA: Diagnosis not present

## 2020-12-20 DIAGNOSIS — Z888 Allergy status to other drugs, medicaments and biological substances status: Secondary | ICD-10-CM | POA: Diagnosis not present

## 2020-12-20 DIAGNOSIS — X58XXXA Exposure to other specified factors, initial encounter: Secondary | ICD-10-CM | POA: Diagnosis not present

## 2020-12-20 DIAGNOSIS — T162XXA Foreign body in left ear, initial encounter: Secondary | ICD-10-CM | POA: Diagnosis not present

## 2020-12-20 DIAGNOSIS — Z79899 Other long term (current) drug therapy: Secondary | ICD-10-CM | POA: Diagnosis not present

## 2021-04-22 ENCOUNTER — Telehealth: Payer: Self-pay | Admitting: Internal Medicine

## 2021-04-22 MED ORDER — LOSARTAN POTASSIUM-HCTZ 50-12.5 MG PO TABS: 1.0000 | ORAL_TABLET | Freq: Every day | ORAL | 0 refills | Status: DC

## 2021-04-22 NOTE — Telephone Encounter (Signed)
?*  STAT* If patient is at the pharmacy, call can be transferred to refill team. ? ? ?1. Which medications need to be refilled? (please list name of each medication and dose if known)  ?losartan-hydrochlorothiazide (HYZAAR) 50-12.5 MG tablet ? ?2. Which pharmacy/location (including street and city if local pharmacy) is medication to be sent to? ?[id number] - [place], [place] [place] [address] ? ?3. Do they need a 30 day or 90 day supply?  ? 90 day supply ? ?

## 2021-04-22 NOTE — Telephone Encounter (Signed)
Pt's medication was sent to pt's pharmacy as requested. Confirmation received.  °

## 2021-05-13 ENCOUNTER — Ambulatory Visit: Payer: BC Managed Care – PPO | Admitting: Internal Medicine

## 2021-05-13 ENCOUNTER — Encounter: Payer: Self-pay | Admitting: Internal Medicine

## 2021-05-13 VITALS — BP 132/89 | HR 68 | Ht 67.5 in | Wt 172.6 lb

## 2021-05-13 DIAGNOSIS — I442 Atrioventricular block, complete: Secondary | ICD-10-CM | POA: Diagnosis not present

## 2021-05-13 DIAGNOSIS — D8685 Sarcoid myocarditis: Secondary | ICD-10-CM | POA: Diagnosis not present

## 2021-05-13 DIAGNOSIS — Z950 Presence of cardiac pacemaker: Secondary | ICD-10-CM

## 2021-05-13 LAB — CUP PACEART INCLINIC DEVICE CHECK
Battery Impedance: 2307 Ohm
Battery Remaining Longevity: 36 mo
Battery Voltage: 2.73 V
Brady Statistic AP VP Percent: 0 %
Brady Statistic AP VS Percent: 1 %
Brady Statistic AS VP Percent: 0 %
Brady Statistic AS VS Percent: 99 %
Date Time Interrogation Session: 20230418171920
Implantable Pulse Generator Implant Date: 20101216
Lead Channel Impedance Value: 475 Ohm
Lead Channel Impedance Value: 567 Ohm
Lead Channel Pacing Threshold Amplitude: 0.625 V
Lead Channel Pacing Threshold Amplitude: 0.75 V
Lead Channel Pacing Threshold Amplitude: 1.25 V
Lead Channel Pacing Threshold Amplitude: 1.5 V
Lead Channel Pacing Threshold Pulse Width: 0.4 ms
Lead Channel Pacing Threshold Pulse Width: 0.4 ms
Lead Channel Pacing Threshold Pulse Width: 0.4 ms
Lead Channel Pacing Threshold Pulse Width: 0.4 ms
Lead Channel Sensing Intrinsic Amplitude: 4 mV
Lead Channel Sensing Intrinsic Amplitude: 8 mV
Lead Channel Setting Pacing Amplitude: 1.5 V
Lead Channel Setting Pacing Amplitude: 3.25 V
Lead Channel Setting Pacing Pulse Width: 0.4 ms
Lead Channel Setting Sensing Sensitivity: 4 mV

## 2021-05-13 NOTE — Progress Notes (Signed)
? ? ? ? ?ELECTROPHYSIOLOGY OFFICE NOTE  ?Patient ID: Kristopher Cannon, MRN: [id number], DOB/AGE: [birth year] 51 y.o. ?Admit date: (Not on file) ?Date of Consult: 05/13/2021 ? ?Primary Physician: Default, Provider, MD ?Primary Cardiologist new ?  ?   ? ?HPI ?Kristopher Cannon is a 51 y.o. 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    ? ?Biopsy results from 2003 were reviewed where he was found to have noncaseating granulomata in his lungs nuclear medicine imaging 2003 demonstrated irregular perfusion consistent with an infiltrative process thus presumed to be sarcoid ? ?The patient denies chest pain, shortness of breath, nocturnal dyspnea, orthopnea or peripheral edema.  There have been no palpitations, lightheadedness or syncope.  ? ? ?DATE TEST EF   ?5/22 PET   normal % No hypermetabolism  ?5/22 CTA   No CA calcifications  ?     ?     ? ?Date Cr K Hgb  ?      ?     ? ?Date PR interval  ?4/15 208  ?3/22 242  ?4/23 238  ? ? ? ? ?Past Medical History:  ?Diagnosis Date  ? Complete heart block (HCC)   ? HYPERTENSION, BENIGN 12/18/2008  ? Qualifier: Diagnosis of  By: Graciela Husbands, MD, Susie Cassette   ? Pacemaker-Medtronic   ? 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   ? ?Surgical History:  ?Past Surgical History:  ?Procedure Laterality Date  ? INSERT / REPLACE / REMOVE PACEMAKER    ?  ? ?Home Meds: ?Current Meds  ?Medication Sig  ? losartan-hydrochlorothiazide (HYZAAR) 50-12.5 MG tablet Take 1 tablet by mouth daily.  ? ? ?Allergies:  ?Allergies  ?Allergen Reactions  ? Prednisone Hives  ?  Hives and rash  ? ? ? ?  ? ? ?ROS:  Please see the history of present illness.     All other systems reviewed and negative.  ? ?Physical Examination  ?BP 132/89   Pulse 68   Ht 5' 7.5" (1.715 m)   Wt 172 lb 9.6 oz (78.3 kg)   SpO2 98%   BMI 26.63 kg/m?  ? ?Well developed and nourished in no acute distress ?HENT normal ?Neck supple with  JVP-  flat   ?Clear ?Device pocket well healed; without hematoma or erythema.  There is no tethering  ?Regular rate and rhythm, no murmurs or gallops ?Abd-soft with active BS ?No Clubbing cyanosis edema ?Skin-warm and dry ?A & Oriented  Grossly normal sensory and motor function ? ?ECG sinus at 68 ?24/09/38 ?  ? ? ?Assessment and Plan:  ?Intermittent heart block ? ?Sinus rate right shifted ? ?Pacemaker-Medtronic ? ?First-degree AV block ? ?Sarcoid-pulmonary noncaseating granulomata//cardiac abnormal thallium perfusion ? ?Hypertension ? ?PET scan negative 5/22   ? ?Device interrogation demonstrates SVT as long as 46 minutes.  Most recently 05/04/2021.   ? ?Prior to initiating beta-blocker therapy, which I am inclined to do also because there has been a right shift in his sinus rates, we need to see if there are any initiating causes such as anemia or hyperthyroidism.  Has been years since he had blood work.  He is supposed to see his PCP next week and we have given him instructions to have his blood drawn and sent to [place]. ? ?Device function is normal ? ?First-degree AV block i.e. PR interval has increased modestly in the last 7 years; relatively stable over the last  year.  We will follow-up ? ? ? ? ?Virl Axe ? ?

## 2021-05-13 NOTE — Patient Instructions (Signed)
Medication Instructions:  ?Your physician recommends that you continue on your current medications as directed. Please refer to the Current Medication list given to you today. ? ?*If you need a refill on your cardiac medications before your next appointment, please call your pharmacy* ? ? ?Lab Work: ?CBC, BMET and TSH with your PCP ? ?If you have labs (blood work) drawn today and your tests are completely normal, you will receive your results only by: ?MyChart Message (if you have MyChart) OR ?A paper copy in the mail ?If you have any lab test that is abnormal or we need to change your treatment, we will call you to review the results. ? ? ?Testing/Procedures: ?None ordered. ? ? ? ?Follow-Up: ?At [facility], you and your health needs are our priority.  As part of our continuing mission to provide you with exceptional heart care, we have created designated Provider Care Teams.  These Care Teams include your primary Cardiologist (physician) and Advanced Practice Providers (APPs -  Physician Assistants and Nurse Practitioners) who all work together to provide you with the care you need, when you need it. ? ?We recommend signing up for the patient portal called "MyChart".  Sign up information is provided on this After Visit Summary.  MyChart is used to connect with patients for Virtual Visits (Telemedicine).  Patients are able to view lab/test results, encounter notes, upcoming appointments, etc.  Non-urgent messages can be sent to your provider as well.   ?To learn more about what you can do with MyChart, go to [email or web address].   ? ?Your next appointment:   ?12 months with Dr Graciela Husbands ? ?Important Information About Sugar ? ? ? ? ?  ?

## 2021-05-19 DIAGNOSIS — Z950 Presence of cardiac pacemaker: Secondary | ICD-10-CM | POA: Diagnosis not present

## 2021-05-19 DIAGNOSIS — I10 Essential (primary) hypertension: Secondary | ICD-10-CM | POA: Diagnosis not present

## 2021-05-19 DIAGNOSIS — I442 Atrioventricular block, complete: Secondary | ICD-10-CM | POA: Diagnosis not present

## 2021-05-19 DIAGNOSIS — D8685 Sarcoid myocarditis: Secondary | ICD-10-CM | POA: Diagnosis not present

## 2021-07-21 ENCOUNTER — Other Ambulatory Visit: Payer: Self-pay | Admitting: Physician Assistant

## 2021-08-15 DIAGNOSIS — K648 Other hemorrhoids: Secondary | ICD-10-CM | POA: Diagnosis not present

## 2021-08-15 DIAGNOSIS — K5730 Diverticulosis of large intestine without perforation or abscess without bleeding: Secondary | ICD-10-CM | POA: Diagnosis not present

## 2021-08-15 DIAGNOSIS — Z1211 Encounter for screening for malignant neoplasm of colon: Secondary | ICD-10-CM | POA: Diagnosis not present

## 2021-11-28 DIAGNOSIS — I10 Essential (primary) hypertension: Secondary | ICD-10-CM | POA: Diagnosis not present

## 2021-11-28 DIAGNOSIS — Z20822 Contact with and (suspected) exposure to covid-19: Secondary | ICD-10-CM | POA: Diagnosis not present

## 2021-11-28 DIAGNOSIS — R051 Acute cough: Secondary | ICD-10-CM | POA: Diagnosis not present

## 2021-11-28 DIAGNOSIS — U071 COVID-19: Secondary | ICD-10-CM | POA: Diagnosis not present

## 2021-12-25 DIAGNOSIS — I10 Essential (primary) hypertension: Secondary | ICD-10-CM | POA: Diagnosis not present

## 2021-12-25 DIAGNOSIS — L089 Local infection of the skin and subcutaneous tissue, unspecified: Secondary | ICD-10-CM | POA: Diagnosis not present

## 2021-12-25 DIAGNOSIS — M79644 Pain in right finger(s): Secondary | ICD-10-CM | POA: Diagnosis not present

## 2021-12-25 DIAGNOSIS — L02511 Cutaneous abscess of right hand: Secondary | ICD-10-CM | POA: Diagnosis not present

## 2021-12-25 DIAGNOSIS — Z888 Allergy status to other drugs, medicaments and biological substances status: Secondary | ICD-10-CM | POA: Diagnosis not present

## 2021-12-25 DIAGNOSIS — M25441 Effusion, right hand: Secondary | ICD-10-CM | POA: Diagnosis not present

## 2022-02-12 DIAGNOSIS — S39012A Strain of muscle, fascia and tendon of lower back, initial encounter: Secondary | ICD-10-CM | POA: Diagnosis not present

## 2022-02-12 DIAGNOSIS — Z79899 Other long term (current) drug therapy: Secondary | ICD-10-CM | POA: Diagnosis not present

## 2022-02-12 DIAGNOSIS — I10 Essential (primary) hypertension: Secondary | ICD-10-CM | POA: Diagnosis not present

## 2022-02-12 DIAGNOSIS — X58XXXA Exposure to other specified factors, initial encounter: Secondary | ICD-10-CM | POA: Diagnosis not present

## 2022-02-12 DIAGNOSIS — Z888 Allergy status to other drugs, medicaments and biological substances status: Secondary | ICD-10-CM | POA: Diagnosis not present

## 2022-02-12 DIAGNOSIS — M545 Low back pain, unspecified: Secondary | ICD-10-CM | POA: Diagnosis not present

## 2022-02-16 DIAGNOSIS — I10 Essential (primary) hypertension: Secondary | ICD-10-CM | POA: Diagnosis not present

## 2022-02-16 DIAGNOSIS — M79652 Pain in left thigh: Secondary | ICD-10-CM | POA: Diagnosis not present

## 2022-02-16 DIAGNOSIS — M2578 Osteophyte, vertebrae: Secondary | ICD-10-CM | POA: Diagnosis not present

## 2022-02-16 DIAGNOSIS — M545 Low back pain, unspecified: Secondary | ICD-10-CM | POA: Diagnosis not present

## 2022-02-16 DIAGNOSIS — Z79899 Other long term (current) drug therapy: Secondary | ICD-10-CM | POA: Diagnosis not present

## 2022-02-16 DIAGNOSIS — Z888 Allergy status to other drugs, medicaments and biological substances status: Secondary | ICD-10-CM | POA: Diagnosis not present

## 2022-02-16 DIAGNOSIS — M5459 Other low back pain: Secondary | ICD-10-CM | POA: Diagnosis not present

## 2022-08-06 ENCOUNTER — Ambulatory Visit: Payer: BC Managed Care – PPO | Admitting: Internal Medicine

## 2022-11-27 ENCOUNTER — Encounter: Payer: Self-pay | Admitting: Internal Medicine

## 2022-11-27 ENCOUNTER — Ambulatory Visit: Payer: BC Managed Care – PPO | Attending: Internal Medicine | Admitting: Internal Medicine

## 2022-11-27 VITALS — BP 148/102 | HR 85 | Ht 67.5 in | Wt 180.0 lb

## 2022-11-27 DIAGNOSIS — Z79899 Other long term (current) drug therapy: Secondary | ICD-10-CM

## 2022-11-27 DIAGNOSIS — D8685 Sarcoid myocarditis: Secondary | ICD-10-CM

## 2022-11-27 DIAGNOSIS — Z950 Presence of cardiac pacemaker: Secondary | ICD-10-CM | POA: Diagnosis not present

## 2022-11-27 DIAGNOSIS — I442 Atrioventricular block, complete: Secondary | ICD-10-CM | POA: Diagnosis not present

## 2022-11-27 DIAGNOSIS — E039 Hypothyroidism, unspecified: Secondary | ICD-10-CM

## 2022-11-27 LAB — CUP PACEART INCLINIC DEVICE CHECK
Battery Impedance: 2709 Ohm
Battery Remaining Longevity: 30 mo
Battery Voltage: 2.72 V
Brady Statistic AP VP Percent: 0 %
Brady Statistic AP VS Percent: 1 %
Brady Statistic AS VP Percent: 0 %
Brady Statistic AS VS Percent: 99 %
Date Time Interrogation Session: 20241101155935
Implantable Pulse Generator Implant Date: 20101216
Lead Channel Impedance Value: 467 Ohm
Lead Channel Impedance Value: 577 Ohm
Lead Channel Pacing Threshold Amplitude: 0.75 V
Lead Channel Pacing Threshold Amplitude: 1.25 V
Lead Channel Pacing Threshold Amplitude: 1.5 V
Lead Channel Pacing Threshold Pulse Width: 0.4 ms
Lead Channel Pacing Threshold Pulse Width: 0.4 ms
Lead Channel Pacing Threshold Pulse Width: 0.4 ms
Lead Channel Sensing Intrinsic Amplitude: 11.2 mV
Lead Channel Sensing Intrinsic Amplitude: 4 mV
Lead Channel Setting Pacing Amplitude: 1.5 V
Lead Channel Setting Pacing Amplitude: 3 V
Lead Channel Setting Pacing Pulse Width: 0.4 ms
Lead Channel Setting Sensing Sensitivity: 4 mV
Zone Setting Status: 755011
Zone Setting Status: 755011

## 2022-11-27 MED ORDER — LOSARTAN POTASSIUM-HCTZ 100-25 MG PO TABS: 1.0000 | ORAL_TABLET | Freq: Every day | ORAL | 3 refills | Status: DC

## 2022-11-27 NOTE — Progress Notes (Signed)
      ELECTROPHYSIOLOGY OFFICE NOTE  Patient ID: Kristopher Cannon, MRN: [id number], DOB/AGE: [birth year] 52 y.o. Admit date: (Not on file) Date of Consult: 11/27/2022  Primary Physician: Medicine, [facility] Primary Cardiologist new       HPI Kristopher Cannon is a 52 y.o. 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  Device is approaching ERI  Biopsy results from 2003 were reviewed where he was found to have noncaseating granulomata in his lungs nuclear medicine imaging 2003 demonstrated irregular perfusion consistent with an infiltrative process thus presumed to be sarcoid  The patient denies chest pain, shortness of breath, nocturnal dyspnea, orthopnea or peripheral edema.  There have been no palpitations, lightheadedness or syncope.   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 MUST KEEP APPOINTMENT IN APRIL FOR REFILLS    Allergies:  Allergies  Allergen Reactions   Prednisone Hives    Hives and rash         ROS:  Please see the history of present illness.     All other systems reviewed and negative.   Physical Examination  BP (!) 148/102   Pulse 85   Ht 5' 7.5" (1.715 m)   Wt 180 lb (81.6 kg)   SpO2 97%    BMI 27.78 kg/m  Well developed and well nourished in no acute distress HENT normal Neck supple with JVP-flat Clear Device pocket well healed; without hematoma or erythema.  There is no tethering  Regular rate and rhythm, no  murmur Abd-soft with active BS No Clubbing cyanosis  edema Skin-warm and dry A & Oriented  Grossly normal sensory and motor function  ECG sinus at 85 Intervals 19/08/34  Device function is normal. Programming changes none  See Paceart for details     Assessment and Plan:  Intermittent heart block  Sinus rate right shifted  Pacemaker-Medtronic  First-degree AV block  Sarcoid-pulmonary noncaseating granulomata//cardiac abnormal thallium perfusion  Hypertension  PET scan negative 5/22    Interval ventricular pacing  Sinus rates still remain right shifted with a median heart rate of about 100.  Will check a CBC and a TSH.  Blood pressure is elevated.  Fairly compliant with his medication.  Will changes losartan HCT from 50/12.5--100/25.  If his potassium level on his check today is relatively low we will plan to recheck in a couple of weeks  Encouraged him to get his physical examination     Sherryl Manges

## 2022-11-27 NOTE — Patient Instructions (Addendum)
Medication Instructions:  Your physician has recommended you make the following change in your medication:   ** Begin Losartan - hydrochlorothiazide 100/25mg  - 1 tablet by mouth daily.  *If you need a refill on your cardiac medications before your next appointment, please call your pharmacy*   Lab Work: CBC, BMET and TSH today  If you have labs (blood work) drawn today and your tests are completely normal, you will receive your results only by: MyChart Message (if you have MyChart) OR A paper copy in the mail If you have any lab test that is abnormal or we need to change your treatment, we will call you to review the results.   Testing/Procedures: None ordered.    Follow-Up: At [facility], you and your health needs are our priority.  As part of our continuing mission to provide you with exceptional heart care, we have created designated Provider Care Teams.  These Care Teams include your primary Cardiologist (physician) and Advanced Practice Providers (APPs -  Physician Assistants and Nurse Practitioners) who all work together to provide you with the care you need, when you need it.  We recommend signing up for the patient portal called "MyChart".  Sign up information is provided on this After Visit Summary.  MyChart is used to connect with patients for Virtual Visits (Telemedicine).  Patients are able to view lab/test results, encounter notes, upcoming appointments, etc.  Non-urgent messages can be sent to your provider as well.   To learn more about what you can do with MyChart, go to [email or web address].    Your next appointment:   6 months

## 2022-11-28 LAB — CBC
Hematocrit: 45 % (ref 37.5–51.0)
Hemoglobin: 14.4 g/dL (ref 13.0–17.7)
MCH: 27.7 pg (ref 26.6–33.0)
MCHC: 32 g/dL (ref 31.5–35.7)
MCV: 87 fL (ref 79–97)
Platelets: 312 10*3/uL (ref 150–450)
RBC: 5.19 x10E6/uL (ref 4.14–5.80)
RDW: 12 % (ref 11.6–15.4)
WBC: 9.4 10*3/uL (ref 3.4–10.8)

## 2022-11-28 LAB — BASIC METABOLIC PANEL
BUN/Creatinine Ratio: 12 (ref 9–20)
BUN: 13 mg/dL (ref 6–24)
CO2: 25 mmol/L (ref 20–29)
Calcium: 10.1 mg/dL (ref 8.7–10.2)
Chloride: 101 mmol/L (ref 96–106)
Creatinine, Ser: 1.07 mg/dL (ref 0.76–1.27)
Glucose: 79 mg/dL (ref 70–99)
Potassium: 4.6 mmol/L (ref 3.5–5.2)
Sodium: 139 mmol/L (ref 134–144)
eGFR: 83 mL/min/{1.73_m2} (ref >59)

## 2022-11-28 LAB — TSH: TSH: 0.738 u[IU]/mL (ref 0.450–4.500)

## 2023-01-05 DIAGNOSIS — I10 Essential (primary) hypertension: Secondary | ICD-10-CM | POA: Diagnosis not present

## 2023-01-05 DIAGNOSIS — Z950 Presence of cardiac pacemaker: Secondary | ICD-10-CM | POA: Diagnosis not present

## 2023-01-05 DIAGNOSIS — Z0000 Encounter for general adult medical examination without abnormal findings: Secondary | ICD-10-CM | POA: Diagnosis not present

## 2023-01-05 DIAGNOSIS — I442 Atrioventricular block, complete: Secondary | ICD-10-CM | POA: Diagnosis not present

## 2023-07-02 ENCOUNTER — Encounter: Payer: BC Managed Care – PPO | Admitting: Internal Medicine

## 2023-07-03 NOTE — Progress Notes (Deleted)
  Electrophysiology Office Note:   Date:  07/03/2023  ID:  Ione Manly, DOB 08-09-1970, MRN [id number]  Primary Cardiologist: None Primary Heart Failure: None Electrophysiologist: Richardo Chandler, MD   {Click to update primary MD,subspecialty MD or APP then REFRESH:1}    History of Present Illness:   Kristopher Cannon is a 53 y.o. male with h/o CHB s/p PPM, HTN, sarcoidosis with pulmonary involvement, suspected cardiac seen today for routine electrophysiology followup.   Since last being seen in our clinic the patient reports doing ***.    He denies chest pain, palpitations, dyspnea, PND, orthopnea, nausea, vomiting, dizziness, syncope, edema, weight gain, or early satiety.   Review of systems complete and found to be negative unless listed in HPI.    EP Information / Studies Reviewed:    EKG is ordered today. Personal review as below.      PPM Interrogation-  reviewed in detail today,  See PACEART report.  Device History: Medtronic Dual Chamber PPM implanted 2003, for intermittent CHB Generator Change > 01/10/2009  Studies:  cPET 5/202 > normal EF, no hypermetabolisim  CTA 06/2020 > no CA calcifications   Arrhythmia / AAD Intermittent CHB s/p PPM     Risk Assessment/Calculations:     No BP recorded.  {Refresh Note OR Click here to enter BP  :1}***        Physical Exam:   VS:  There were no vitals taken for this visit.   Wt Readings from Last 3 Encounters:  11/27/22 180 lb (81.6 kg)  05/13/21 172 lb 9.6 oz (78.3 kg)  04/02/20 172 lb (78 kg)     GEN: Well nourished, well developed in no acute distress NECK: No JVD; No carotid bruits CARDIAC: {EPRHYTHM:28826}, no murmurs, rubs, gallops RESPIRATORY:  Clear to auscultation without rales, wheezing or rhonchi  ABDOMEN: Soft, non-tender, non-distended EXTREMITIES:  No edema; No deformity   ASSESSMENT AND PLAN:    Intermittent CHB s/p Medtronic PPM  -Normal PPM function -See Pace Art report -No changes today -if at  ERI, update ECHO ***  -no ventricular arrhythmias noted on device ***   1AVB -EKG with stable PR ***   Hypertension  -well controlled on current regimen ***  Sarcoidosis  Pulmonary non-caseating granulomas, abnormal thallium perfusion / 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

## 2023-07-05 ENCOUNTER — Ambulatory Visit: Attending: Pulmonary Disease | Admitting: Pulmonary Disease

## 2023-07-05 DIAGNOSIS — I442 Atrioventricular block, complete: Secondary | ICD-10-CM

## 2023-07-05 DIAGNOSIS — D8685 Sarcoid myocarditis: Secondary | ICD-10-CM

## 2023-07-05 DIAGNOSIS — Z950 Presence of cardiac pacemaker: Secondary | ICD-10-CM

## 2023-07-06 ENCOUNTER — Encounter: Payer: Self-pay | Admitting: Pulmonary Disease

## 2023-08-23 DIAGNOSIS — M25572 Pain in left ankle and joints of left foot: Secondary | ICD-10-CM | POA: Diagnosis not present

## 2023-08-23 DIAGNOSIS — S70212A Abrasion, left hip, initial encounter: Secondary | ICD-10-CM | POA: Diagnosis not present

## 2023-08-23 DIAGNOSIS — S90512A Abrasion, left ankle, initial encounter: Secondary | ICD-10-CM | POA: Diagnosis not present

## 2023-08-23 DIAGNOSIS — S50812A Abrasion of left forearm, initial encounter: Secondary | ICD-10-CM | POA: Diagnosis not present

## 2023-08-23 DIAGNOSIS — S7002XA Contusion of left hip, initial encounter: Secondary | ICD-10-CM | POA: Diagnosis not present

## 2023-08-23 DIAGNOSIS — Y92413 State road as the place of occurrence of the external cause: Secondary | ICD-10-CM | POA: Diagnosis not present

## 2023-08-23 DIAGNOSIS — Y9389 Activity, other specified: Secondary | ICD-10-CM | POA: Diagnosis not present

## 2023-08-23 DIAGNOSIS — S70312A Abrasion, left thigh, initial encounter: Secondary | ICD-10-CM | POA: Diagnosis not present

## 2023-08-23 DIAGNOSIS — I10 Essential (primary) hypertension: Secondary | ICD-10-CM | POA: Diagnosis not present

## 2023-09-15 NOTE — Progress Notes (Unsigned)
 Electrophysiology Office Note:   Date:  09/17/2023  ID:  [name], DOB 08-20-1970, MRN [id number]  Primary Cardiologist: None Primary Heart Failure: None Electrophysiologist: Elspeth Sage, MD       History of Present Illness:   Kristopher Cannon is a 53 y.o. male with h/o CHB s/p PPM, presume cardiac sarcoidosis (pulmonary biopsy 2003 with noncaseating granuloma's, NM imaging 2003 with irregular perfusion c/w an infiltrative process presumed to be sarcoid) seen today for routine electrophysiology followup.   Since last being seen in our clinic the patient reports doing well overall. He has no device related concerns. Reports BP at home running in the 140's systolic.   He denies chest pain, palpitations, dyspnea, PND, orthopnea, nausea, vomiting, dizziness, syncope, edema, weight gain, or early satiety.   Review of systems complete and found to be negative unless listed in HPI.   EP Information / Studies Reviewed:    EKG is ordered today. Personal review as below.  EKG Interpretation Date/Time:  Friday September 17 2023 15:37:00 EDT Ventricular Rate:  75 PR Interval:  250 QRS Duration:  86 QT Interval:  372 QTC Calculation: 415 R Axis:   51  Text Interpretation: Sinus rhythm with 1st degree A-V block Confirmed by Aniceto Jarvis ([id number]) on 09/17/2023 4:08:59 PM   PPM Interrogation-  reviewed in detail today,  See PACEART report.  Device History: Medtronic Dual Chamber PPM implanted 2003 for CHB Generator Change > 01/10/2009  Risk Assessment/Calculations:     HYPERTENSION CONTROL Vitals:   09/17/23 1533 09/17/23 1647  BP: (!) 142/100 (!) 146/94    The patient's blood pressure is elevated above target today.  In order to address the patient's elevated BP: A new medication was prescribed today.; A current anti-hypertensive medication was adjusted today.           Physical Exam:   VS:  BP (!) 146/94   Pulse 75   Ht 5' 7.5 (1.715 m)   Wt 171 lb (77.6 kg)   SpO2 99%    BMI 26.39 kg/m    Wt Readings from Last 3 Encounters:  09/17/23 171 lb (77.6 kg)  11/27/22 180 lb (81.6 kg)  05/13/21 172 lb 9.6 oz (78.3 kg)     GEN: Well nourished, well developed in no acute distress NECK: No JVD; No carotid bruits CARDIAC: Regular rate and rhythm, no murmurs, rubs, gallops. 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 AT/AF <0.1%  Hypertension  -elevated in clinic on Losartan -hydrochlorothiazide  max dose > change to Valsartan -hydrochlorothiazide  160 mg / 12.5mg   -follow up labs in 7-10 days -if does not tolerate from K/Cr standpoint, could go back to prior losartan /hydrochlorothiazide  dosing and add low dose BB for AT / HVR episodes  -he plans to go back to his PCP which is close to work to have a repeat BP check  Pulmonary Sarcoidosis  Biopsy proven 2003  -per Pulmonary / Primary   Disposition:   Follow up with EP APP 11 months as anticipate he will be at [facility]  Transition to Dr. Kennyth for EP   Signed, Jarvis Aniceto, NP-C, AGACNP-BC  [facility] - Electrophysiology  09/17/2023, 4:47 PM

## 2023-09-17 ENCOUNTER — Encounter: Payer: Self-pay | Admitting: Pulmonary Disease

## 2023-09-17 ENCOUNTER — Ambulatory Visit: Attending: Pulmonary Disease | Admitting: Pulmonary Disease

## 2023-09-17 ENCOUNTER — Other Ambulatory Visit: Payer: Self-pay | Admitting: *Deleted

## 2023-09-17 VITALS — BP 146/94 | HR 75 | Ht 67.5 in | Wt 171.0 lb

## 2023-09-17 DIAGNOSIS — Z950 Presence of cardiac pacemaker: Secondary | ICD-10-CM | POA: Diagnosis not present

## 2023-09-17 DIAGNOSIS — I442 Atrioventricular block, complete: Secondary | ICD-10-CM | POA: Diagnosis not present

## 2023-09-17 DIAGNOSIS — D8685 Sarcoid myocarditis: Secondary | ICD-10-CM | POA: Diagnosis not present

## 2023-09-17 DIAGNOSIS — Z79899 Other long term (current) drug therapy: Secondary | ICD-10-CM

## 2023-09-17 LAB — CUP PACEART INCLINIC DEVICE CHECK
Battery Impedance: 3555 Ohm
Battery Remaining Longevity: 10 mo
Battery Voltage: 2.66 V
Brady Statistic AP VP Percent: 0 %
Brady Statistic AP VS Percent: 0 %
Brady Statistic AS VP Percent: 0 %
Brady Statistic AS VS Percent: 100 %
Date Time Interrogation Session: 20250822164509
Implantable Pulse Generator Implant Date: 20101216
Lead Channel Impedance Value: 439 Ohm
Lead Channel Impedance Value: 526 Ohm
Lead Channel Pacing Threshold Amplitude: 0.25 V
Lead Channel Pacing Threshold Amplitude: 0.5 V
Lead Channel Pacing Threshold Amplitude: 1.5 V
Lead Channel Pacing Threshold Amplitude: 2.25 V
Lead Channel Pacing Threshold Pulse Width: 0.4 ms
Lead Channel Pacing Threshold Pulse Width: 0.4 ms
Lead Channel Pacing Threshold Pulse Width: 0.4 ms
Lead Channel Pacing Threshold Pulse Width: 0.46 ms
Lead Channel Sensing Intrinsic Amplitude: 4 mV
Lead Channel Sensing Intrinsic Amplitude: 8 mV
Lead Channel Setting Pacing Amplitude: 1.5 V
Lead Channel Setting Pacing Amplitude: 4.5 V
Lead Channel Setting Pacing Pulse Width: 0.46 ms
Lead Channel Setting Sensing Sensitivity: 2.8 mV
Zone Setting Status: 755011
Zone Setting Status: 755011

## 2023-09-17 MED ORDER — VALSARTAN-HYDROCHLOROTHIAZIDE 160-12.5 MG PO TABS: 1.0000 | ORAL_TABLET | Freq: Every day | ORAL | 11 refills | Status: DC

## 2023-09-17 NOTE — Patient Instructions (Addendum)
 Medication Instructions:  1.Stop losartan -hydrochlorothiazide  2.Start valsartan -hydrochlorothiazide  (Diovan -HCT) 160-12.5 mg daily *If you need a refill on your cardiac medications before your next appointment, please call your pharmacy*  Lab Work: BMET-IN 1 WEEK If you have labs (blood work) drawn today and your tests are completely normal, you will receive your results only by: MyChart Message (if you have MyChart) OR A paper copy in the mail If you have any lab test that is abnormal or we need to change your treatment, we will call you to review the results.  Follow-Up: At [facility], you and your health needs are our priority.  As part of our continuing mission to provide you with exceptional heart care, our providers are all part of one team.  This team includes your primary Cardiologist (physician) and Advanced Practice Providers or APPs (Physician Assistants and Nurse Practitioners) who all work together to provide you with the care you need, when you need it.  Your next appointment:   11 month(s)  Provider:   Daphne Barrack, NP

## 2023-10-03 ENCOUNTER — Ambulatory Visit: Payer: Self-pay | Admitting: Cardiology

## 2023-10-07 MED ORDER — METOPROLOL SUCCINATE ER 25 MG PO TB24: 25.0000 mg | ORAL_TABLET | Freq: Every day | ORAL | 3 refills | Status: AC

## 2023-10-07 MED ORDER — LOSARTAN POTASSIUM-HCTZ 100-25 MG PO TABS: 1.0000 | ORAL_TABLET | Freq: Every day | ORAL | 3 refills | Status: AC
